# Patient Record
Sex: Female | Born: 1980 | Hispanic: No | Marital: Single
Health system: Southern US, Community
[De-identification: ages and names within clinical notes are randomized; demographics above are authoritative.]

---

## 2004-05-23 ENCOUNTER — Other Ambulatory Visit: Admission: RE | Admit: 2004-05-23 | Discharge: 2004-05-23 | Payer: Self-pay | Admitting: Family Medicine

## 2004-05-23 ENCOUNTER — Other Ambulatory Visit: Admission: RE | Admit: 2004-05-23 | Discharge: 2004-05-23 | Payer: Self-pay | Admitting: *Deleted

## 2005-07-23 ENCOUNTER — Ambulatory Visit: Payer: Self-pay | Admitting: Family Medicine

## 2005-11-18 ENCOUNTER — Ambulatory Visit: Payer: Self-pay | Admitting: Family Medicine

## 2006-01-07 ENCOUNTER — Other Ambulatory Visit: Admission: RE | Admit: 2006-01-07 | Discharge: 2006-01-07 | Payer: Self-pay | Admitting: Obstetrics and Gynecology

## 2006-06-05 ENCOUNTER — Encounter (INDEPENDENT_AMBULATORY_CARE_PROVIDER_SITE_OTHER): Payer: Self-pay | Admitting: Specialist

## 2006-06-05 ENCOUNTER — Ambulatory Visit (HOSPITAL_COMMUNITY): Admission: RE | Admit: 2006-06-05 | Discharge: 2006-06-05 | Payer: Self-pay | Admitting: Obstetrics and Gynecology

## 2006-12-16 ENCOUNTER — Ambulatory Visit: Admission: RE | Admit: 2006-12-16 | Discharge: 2006-12-16 | Payer: Self-pay | Admitting: Gynecologic Oncology

## 2007-03-27 ENCOUNTER — Emergency Department (HOSPITAL_COMMUNITY): Admission: EM | Admit: 2007-03-27 | Discharge: 2007-03-27 | Payer: Self-pay | Admitting: Emergency Medicine

## 2007-05-12 ENCOUNTER — Ambulatory Visit: Admission: RE | Admit: 2007-05-12 | Discharge: 2007-05-12 | Payer: Self-pay | Admitting: Gynecologic Oncology

## 2008-07-19 ENCOUNTER — Ambulatory Visit: Payer: Self-pay | Admitting: Internal Medicine

## 2008-07-19 DIAGNOSIS — H6690 Otitis media, unspecified, unspecified ear: Secondary | ICD-10-CM | POA: Insufficient documentation

## 2008-12-21 ENCOUNTER — Ambulatory Visit: Admission: RE | Admit: 2008-12-21 | Discharge: 2008-12-21 | Payer: Self-pay | Admitting: Gynecologic Oncology

## 2009-04-11 ENCOUNTER — Ambulatory Visit (HOSPITAL_BASED_OUTPATIENT_CLINIC_OR_DEPARTMENT_OTHER): Admission: RE | Admit: 2009-04-11 | Discharge: 2009-04-11 | Payer: Self-pay | Admitting: Gynecologic Oncology

## 2009-05-02 ENCOUNTER — Ambulatory Visit: Admission: RE | Admit: 2009-05-02 | Discharge: 2009-05-02 | Payer: Self-pay | Admitting: Gynecologic Oncology

## 2011-01-14 ENCOUNTER — Other Ambulatory Visit: Payer: Self-pay | Admitting: Gastroenterology

## 2011-01-27 ENCOUNTER — Ambulatory Visit
Admission: RE | Admit: 2011-01-27 | Discharge: 2011-01-27 | Disposition: A | Discharge status: Home / Self Care | Payer: BC Managed Care – PPO | Source: Ambulatory Visit | Attending: Gastroenterology | Admitting: Gastroenterology

## 2011-03-26 LAB — POCT PREGNANCY, URINE

## 2011-04-29 NOTE — Consult Note (Signed)
[name], Lane NO.:  [id number]   MEDICAL RECORD NO.:  [id number]          PATIENT TYPE:  OUT   LOCATION:  GYN                          FACILITY:  [facility]   PHYSICIAN:  Laurette Schimke, MD     DATE OF BIRTH:  12-27-1980   DATE OF CONSULTATION:  12/21/2008  DATE OF DISCHARGE:                                 CONSULTATION   This is a 30 year old initially seen in 2008 for condylomatous changes.  She was treated with cryotherapy by Dr. Vincente Poli, and recurrence was  noted.  She underwent surgical excision with negative pathology.   In 2008, at the time of initial presentation, she was noted to have  multiple lesions throughout the labia majora, extending from the  posterior fourchette from the mons.  She has tried Aldara, for which she  developed a sensitivity reaction and was switched to Condylox with  improvement.  She was unable to support the use of the Condylox and  attempted a trial of TCA, which she reports was associated with a great  deal of an amount of pain and pruritus.  With the Condylox, there was  not complete compliance, and there was increase in the size of the  lesion with increasing pruritus.  She has received Gardasil and is  scheduled for a Pap test next week.   PAST MEDICAL HISTORY:  Unchanged.   PRIOR SURGICAL HISTORY:  Unchanged.   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  She is not in a relationship  right now and is reluctant to do so because of the presence of the  lesions and fear of transmitting it to a partner.   PHYSICAL EXAMINATION:  This is a well-developed female in no acute  distress.  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  No perianal lesions are  appreciated.   PLAN:  Options discussed with Amber Lane are that of continued trial of  Condylox versus excision with CO2 laser.  She  opts for definitive  treatment.  Discussion was had with Amber Lane regarding the possibility  that these lesions  could return in the future for a variety of reasons.  She is also  counseled regarding the effects of CO2 laser and postoperative care.  The plan is for surgical management of these lesions, which may include  biopsy, after she has completed her bar exam.      Laurette Schimke, MD  Electronically Signed     WB/MEDQ  D:  12/21/2008  T:  12/21/2008  Job:  [id number]   cc:   Marcelino Duster L. Vincente Poli, M.D.  Fax: [phone]   Telford Nab, R.N.  [address]  [place], [place] [address]

## 2011-04-29 NOTE — Consult Note (Signed)
Amber Lane, Amber Lane                  ACCOUNT NO.:  [id number]   MEDICAL RECORD NO.:  [id number]          PATIENT TYPE:  OUT   LOCATION:  GYN                          FACILITY:  [facility]   PHYSICIAN:  Paola A. Duard Brady, MD    DATE OF BIRTH:  Jan 24, 1981   DATE OF CONSULTATION:  05/12/2007  DATE OF DISCHARGE:                                 CONSULTATION   The patient is a 30 year old who was initially seen by me in January  2008 for condylomatous changes.  She has had cryotherapy by Dr. Vincente Poli  and noticed recurrent condyloma.  In June 2007, she underwent surgical  excision of the condyloma with negative pathology.  She continued to  have issues and was seen by [place].  Saw her in January 2008.  At that time,  she was noted to have multiple scattered condylomatous lesions  throughout the labia majora up to the posterior fourchette from the  mons.  She tried Aldara for about 3 months and had what appears to be a  sensitivity to Aldara versus just a normal reaction with some erythema  blistering and was encouraged to stop it.  She comes in today for  followup.  She did receive her third of her 3 HPV injections.  When she  had the area of blistering, she had herpes testing done by Dr. Vincente Poli  which was negative.  In addition, she had a Pap smear by Dr. Vincente Poli that  per the patient's report she thinks revealed atypical squamous cells,  and they have follow up scheduled for 6 months.  She is currently on her  menstrual cycle and is otherwise doing well.  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   PHYSICAL EXAMINATION:  VITAL SIGNS:  Weight 121 pounds.  GENERAL:  Well-nourished, well-developed female in no acute distress.  PELVIC:  External genitalia is notable for small, approximately 3-4 mm,  condylomatous lesions scattered throughout the mons extending down the  labia majora to the perineal area.  There are worse on the left than  they are on the  right though the majority of the mons lesions are on the  right side.  There is one small lesion inferior to the anus measuring  approximately 3 mm.  There is no lesions in the labia minora.  Speculum  exam was deferred as the patient had a tampon in.   ASSESSMENT:  A 30 year old with condyloma.  We had a discussion  regarding options including continuing the Aldara.  I do not think she  had an allergic reaction.  I think she just had the typical immune  related reaction to the Aldara versus Condylox versus laser ablation.  After our discussion, she wishes to proceed with Condylox.  She was  given a prescription for Condylox 0.5% gel to apply b.i.d. for 3 days  once a week for up to 4 weeks.  Instructions regarding application were  given to the patient.  The patient was shown her lesions with a mirror  and is comfortable doing this.  If after months she continues to have  lesions, she would like to consider laser ablation of these lesions  which we can schedule prior to her starting law school to give her  enough convalescence.  She will call us to schedule this if the lesions  have not resolved with the Condylox.  She will follow up with Dr. Vincente Poli  for followup of her Pap smear.      Paola A. Duard Brady, MD  Electronically Signed     PAG/MEDQ  D:  05/12/2007  T:  05/12/2007  Job:  [id number]   cc:   Marcelino Duster L. Vincente Poli, M.D.  Fax: [phone]   Telford Nab, R.N.  [address]  [place], [place] [address]

## 2011-04-29 NOTE — Consult Note (Signed)
Amber Lane, Amber Lane                  ACCOUNT NO.:  [id number]   MEDICAL RECORD NO.:  [id number]          PATIENT TYPE:  OUT   LOCATION:  GYN                          FACILITY:  [facility]   PHYSICIAN:  Paola A. Duard Brady, MD    DATE OF BIRTH:  13-Aug-1981   DATE OF CONSULTATION:  05/02/2009  DATE OF DISCHARGE:                                 CONSULTATION   Patient is a very pleasant 30 year old with a distant history of  condyloma.  She has had cryotherapy by Dr. Vincente Poli.  Most recently she  was seen by my partner, Dr. Laurette Schimke, and was noted to have fairly  extensive condylomatous changes in January of 2007.  She delayed  intervention due to financial issues.  Subsequently, on April 11, 2009,  she underwent extensive laser ablation of condylomatous lesions.  Operative findings included extensive condylomatous lesions on the  external surface just at the labia majora extending onto the mons.  The  lesions ranged anywhere from 4 to 5 mm in size to 2 cm in size.  There  were 2 small approximately 3- to 4-mm lesions of the labia minora on her  left side.  She comes in today for a postoperative check.  She overall  did very well.  She states her pain was well controlled.  She think she  feels a small scab and she is aware that there might be a new condyloma  on her mons.  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  We did have her take  her birth control pills continuously and miss her last period due to  concerns about her being able to use a tampon or pad.  She is now having  some spotting though she still has another week before her menses on  this current pill pack.  She also states that she has some hot flashes  during the month.  She has spoken to Dr. Vincente Poli about this.  She had  labs done which were negative but she does not know why she has hot  flashes.  She is on Loestrin 1/20 birth control pill.   PHYSICAL EXAMINATION:   Weight 121 pounds.  Height 5 feet 6.  Blood  pressure 110/78.  Well-nourished, well-developed female in no acute  distress.  PELVIC:  External genitalia is notable for well-healing condylomatous  lesions.  There is a scab at one on her mons, otherwise there are no new  lesions.   ASSESSMENT:  77. 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  She is healing well.  I have      encouraged her to put vitamin E on these lesions and rub it in to      help with the scarring.  She will continue close surveillance.  I      discussed with her regarding the spotting is probably because she      took her birth control pills continuously, to go ahead and stop      them today so she could force a period and  start her birth control      pills back on May 13, 2009.  2  With regard to the hot flashes, it might be because she is on  Loestrin pill.  She can discuss the pros and cons of going on a higher  dose estrogen birth control pill with Dr. Vincente Poli.  1. She wondered whether there was anything she could do to prevent an      outbreak.  She did receive the HPV vaccine which has been known to      decrease recurrent outbreaks of condylomatous lesions by at least      50% in patients who are HPV exposed prior to vaccination.  She does      not smoke.  Otherwise, there is nothing else she      can do to mitigate her symptoms.  I have encouraged her though if      she does notice some lesions to be more forthright in coming back      sooner so that they do not become so extensive.  She will continue      her followup with Dr. Vincente Poli and will return to see [place] p.r.n.      Paola A. Duard Brady, MD  Electronically Signed     PAG/MEDQ  D:  05/02/2009  T:  05/02/2009  Job:  [id number]   cc:   Marcelino Duster L. Vincente Poli, M.D.  Fax: [phone]   Telford Nab, R.N.  [address]  [place], [place] [address]

## 2011-04-29 NOTE — Op Note (Signed)
NAMEMICHAL, Amber Lane                  ACCOUNT NO.:  [id number]   MEDICAL RECORD NO.:  [id number]          PATIENT TYPE:  AMB   LOCATION:  [facility]                         FACILITY:  [facility]   PHYSICIAN:  Paola A. Duard Brady, MD    DATE OF BIRTH:  07/08/1981   DATE OF PROCEDURE:  04/11/2009  DATE OF DISCHARGE:                               OPERATIVE REPORT   PREOPERATIVE DIAGNOSIS:  Diffuse vulvar condyloma.   POSTOPERATIVE DIAGNOSIS:  Diffuse vulvar condyloma.   PROCEDURE:  Extensive laser ablation of condylomatous lesions.   ANESTHESIA:  General.   ANESTHESIOLOGIST:  Brayton Caves, MD.   SURGEON:  Paola A. Duard Brady, MD.   ESTIMATED BLOOD LOSS:  Minimal.   IV FLUIDS:  700 mL.   URINE OUTPUT:  Not reported.   COMPLICATIONS:  None.   PATHOLOGY:  None.   OPERATIVE FINDINGS:  Included extensive condylomatous lesions on the  external surface inside of the labia majora extending onto the mons.  The lesions ranged anywhere from 4-5 mm in size to 2 cm in size.  There  are two small approximately 3-4 mm lesions on the labia minora on the  patient's left side.   After informed consent was obtained the patient was examined.  Risks and  benefits of surgery reviewed.  She was taken to the operating room and  placed in a supine position.  General anesthesia with an LMA was placed.  After general anesthesia was induced she was placed in a dorsal  lithotomy position.  The perineum was cleansed with Hibiclens.  Time-out  was performed after the patient was draped to confirm the patient, the  procedure, the surgeon, antibiotic and allergy status.  The patient was  then draped with moist towels surrounding the perineum.  The laser had  been previously tested.  With a watt of 8 continuous the vulvar lesions  as described in the findings above were ablated to the appropriate  depth.  There was adequate hemostasis.  There was good cosmetic and  surgical outcome.  Silver nitrate was applied.   All  instrument and Ray-Tec counts were correct x2.  The patient  tolerated the procedure and was taken to the recovery room in stable  condition.      Paola A. Duard Brady, MD  Electronically Signed     PAG/MEDQ  D:  04/12/2009  T:  04/12/2009  Job:  [id number]   cc:   Telford Nab, R.N.  [address]  [place], [place] [address]

## 2011-05-02 NOTE — Op Note (Signed)
NAMENEVELYN, Amber Lane                  ACCOUNT NO.:  [id number]   MEDICAL RECORD NO.:  [id number]          PATIENT TYPE:  AMB   LOCATION:  [facility]                           FACILITY:  [facility]   PHYSICIAN:  Michelle L. Grewal, M.D.DATE OF BIRTH:  01-31-1981   DATE OF PROCEDURE:  06/05/2006  DATE OF DISCHARGE:                                 OPERATIVE REPORT   PREOPERATIVE DIAGNOSIS:  Vulvar condyloma.   POSTOPERATIVE DIAGNOSIS:  Vulvar condyloma.   PROCEDURE:  Resection of vulvar condyloma.   SURGEON:  Michelle L. Vincente Poli, M.D.   ANESTHESIA:  MAC with LMA.   SPECIMENS:  Condyloma.   ESTIMATED BLOOD LOSS:  Minimal.   DESCRIPTION OF PROCEDURE:  The patient was taken to the operating room.  She  was given sedation.  She was placed in the lithotomy position.  She was  prepped and draped in a sterile fashion.  Exam under anesthesia revealed two  large history of the vulvar condylomas in a linear pattern on the right and  left side with some scattered areas on the perineum and on the mons.  Local  was infiltrated with the two strips and then using a scalpel all areas were  shaved off with a scalpel and with Metzenbaum scissors except for the small  flat lesions in the perineum.  She had no pararectal lesions noted.  On the  left side hemostasis was excellent but I did reapproximate the skin edges  together with 2-0 Vicryl suture interrupted in two places.  We had excellent  reapproximation of the skin edges.  The flat lesions in the middle I elected  to use a Bovie because they were just diffuse scattered ones and I did  remove a few small ones with the Metzenbaum scissors.  The rest of the  lesions that were excised in these areas were then covered with Silvadene  and ointment.  Hemostasis was excellent.  The patient went to the recovery  room in stable condition.  All sponge, lap and instrument counts were  correct x2.  Pathology is condyloma.  Complications none.      Michelle L. Vincente Poli,  M.D.  Electronically Signed     MLG/MEDQ  D:  06/05/2006  T:  06/05/2006  Job:  [id number]

## 2011-05-02 NOTE — Consult Note (Signed)
NAMEDESHANDA, Lane                  ACCOUNT NO.:  [id number]   MEDICAL RECORD NO.:  [id number]          PATIENT TYPE:  OUT   LOCATION:  GYN                          FACILITY:  [facility]   PHYSICIAN:  Paola A. Duard Brady, MD    DATE OF BIRTH:  12-Dec-1981   DATE OF CONSULTATION:  12/16/2006  DATE OF DISCHARGE:                                 CONSULTATION   The patient is seen today in consultation request of Dr. Vincente Poli.  Ms.  Lane is a very pleasant, 30 year old, gravida 1, para 0, aborta 1, whose  last cycle was 2 weeks ago.  Starting about a year ago, she noticed some  vulvar condyloma, was treated with cryotherapy by Dr. Vincente Poli.  Had a  recurrent disease in different locations and, in June 2007, underwent  surgical excision of vulvar condyloma.  Her surgery was on June 05, 2006.  Final pathology was consistent with condyloma.  She has had  increasing condyloma since that time and comes in today for followup.  In addition, she has also had some low-grade dysplasia.  She underwent  colposcopy with biopsies and ECC February 2007, and has a followup  scheduled with Dr. Vincente Poli, January 17, for repeat Pap smear.  She was  last seen by Dr. Vincente Poli on December 4 and it was recommended that she  had vulvar condylomas throughout the vulva.  She had a wet prep that was  unremarkable secondary to complaints of vaginal discharge and is  referred to [place] today.  She is overall doing fairly well.  She did use  Aldara for a very short term prior to proceeding with her cryotherapy.  She states she may have used it once or twice and never long enough to  see if there was any benefit.  She is receiving HPV vaccine and has  received 2 out of a planned 3 vaccinations.   PAST SURGICAL HISTORY:  As above.  She has also had wisdom teeth  removal.   PAST MEDICAL HISTORY:  None.   MEDICATIONS:  Loestrin.   ALLERGIES:  None.   SOCIAL HISTORY:  She smokes tobacco socially.  She drinks alcohol  socially.  She is  single.  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   FAMILY HISTORY:  Her father had diabetes.  Her mother has pituitary  adenoma.   PHYSICAL EXAMINATION:  Height 5 feet 4 inches.  Weight 122 pounds.  Blood pressure 105/68.  Pulse 80.  Respirations 18.  Well-nourished, well-developed female in no acute distress.  PELVIC:  External genitalia is notable for multiple scattered  condylomatous lesions throughout the labia major and extending up to the  mons down to the posterior fourchette with some small scattered  condyloma in the perianal region.   ASSESSMENT:  A 29 year old with diffuse condylomatous changes.  I  discussed with her that we could proceed with laser ablation but this  would be a fairly extensive area to laser associated with some  discomfort and probably missing some classes secondary to pain and  recovery.  I also discussed that she may  be a good candidate for Aldara.  She never used it for long term and patients have had excellent results  with this.  I discussed with her that even if the Aldara does not remove  all of the condylomatous lesions, it may be able to diminish the amount  of lasering that is required.  She, at this point, is interested in  proceeding with Aldara.  She was shown her lesions with a mirror and I  described at length how to use the Aldara and to use a mirror to  carefully apply the Aldara to the affected areas.  She was given a  prescription for Aldara and return to see me in 3 months.  In the  meantime, she will follow up with Dr. Vincente Poli for followup of her Pap  smear, January 17.      Paola A. Duard Brady, MD  Electronically Signed     PAG/MEDQ  D:  12/16/2006  T:  12/16/2006  Job:  [id number]   cc:   Marcelino Duster L. Vincente Poli, M.D.  Fax: [phone]   Telford Nab, R.N.  [address]  [place], [place] [address]

## 2012-09-20 IMAGING — RF DG ESOPHAGUS
11 of 13 series · 19 of 24 positions shown · non-contrast
Comparison: None.

CLINICAL DATA: Dysphagia

ESOPHOGRAM/BARIUM SWALLOW
TECHNIQUE: Combined double contrast and single contrast
examination performed using effervescent crystals, thick barium
liquid, and thin barium liquid.
Fluoroscopy time:  1.8 minutes.

[Series 2: run · 1 of 1 slices shown (1 of 11)]
[im 1/1]
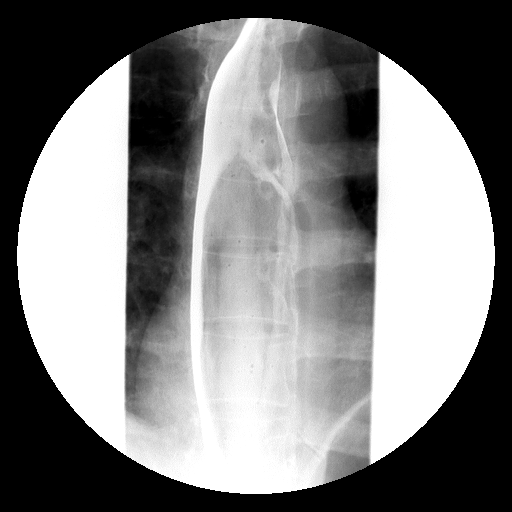

[Series 3: run · 1 of 1 slices shown (2 of 11)]
[im 1/1]
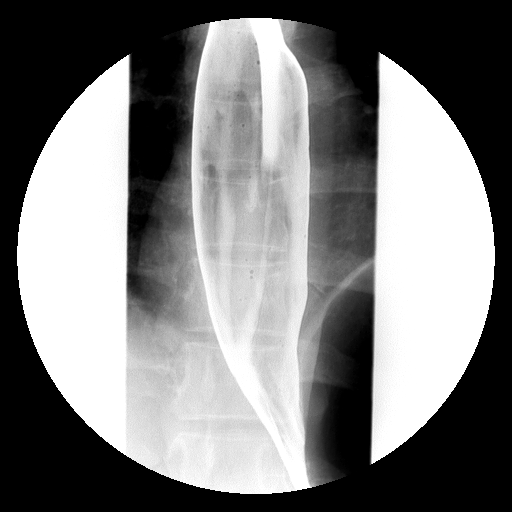

[Series 5: run · 1 of 1 slices shown (3 of 11)]
[im 1/1]
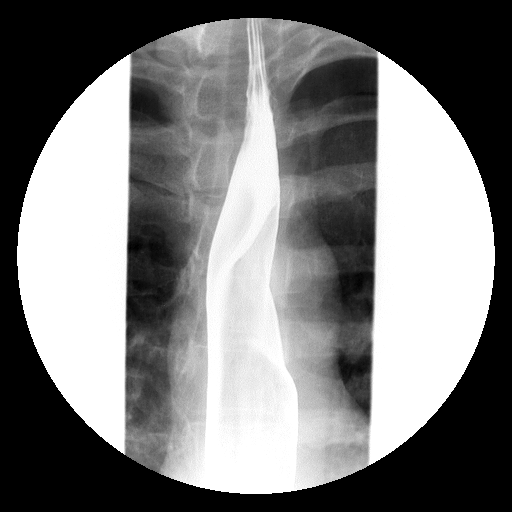

[Series 6: run · 5 of 7 slices shown (4 of 11)]
[im 1/7]
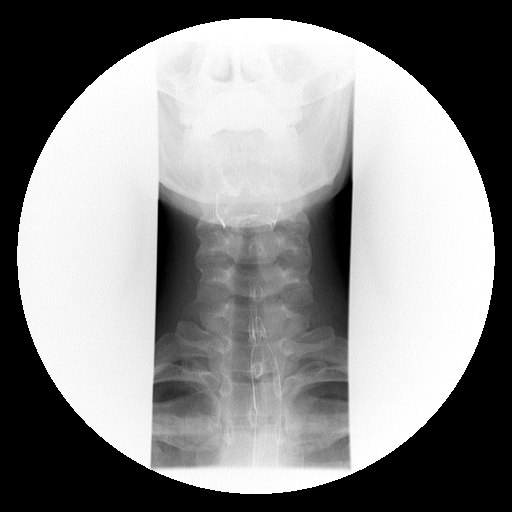
[im 2/7]
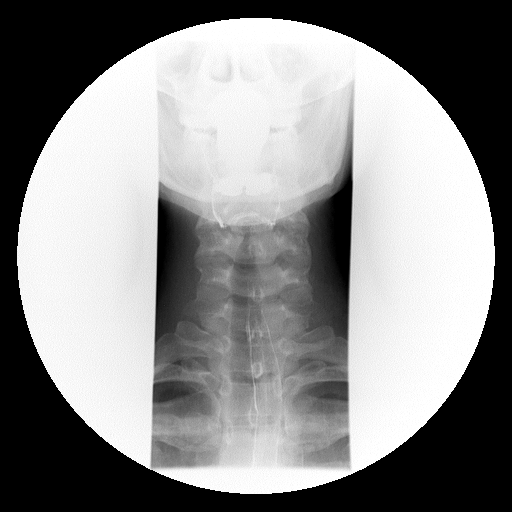
[im 3/7]
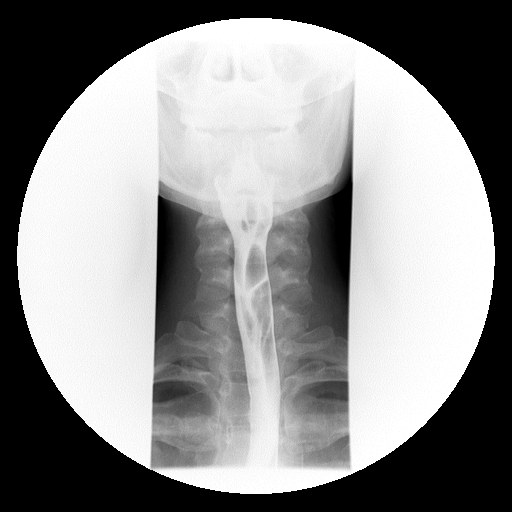
[im 5/7]
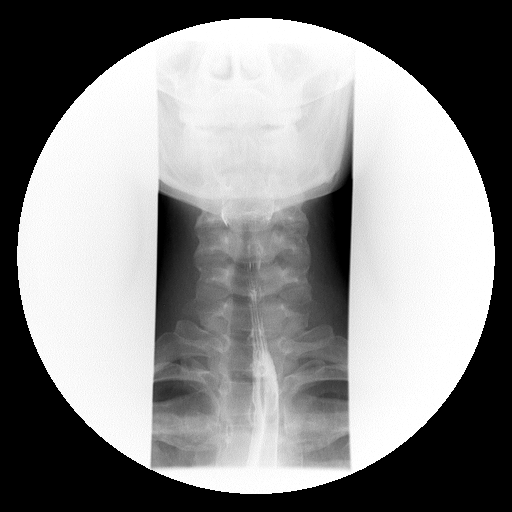
[im 7/7]
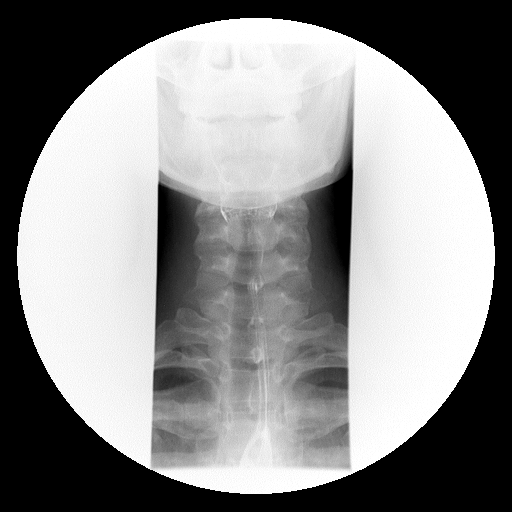

[Series 7: run · 5 of 8 slices shown (5 of 11)]
[im 1/8]
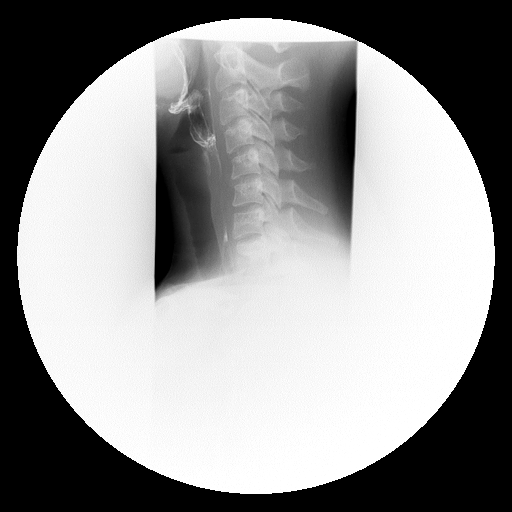
[im 3/8]
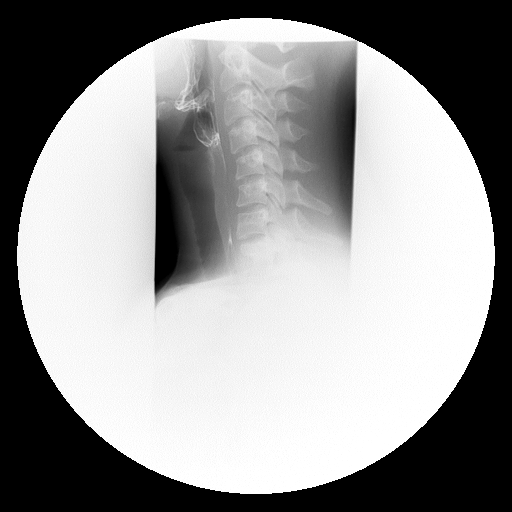
[im 4/8]
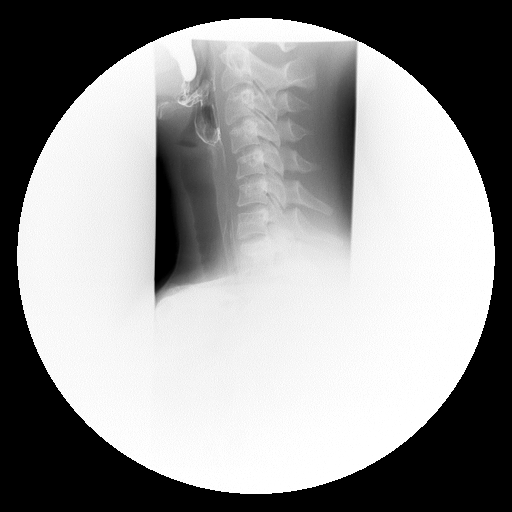
[im 5/8]
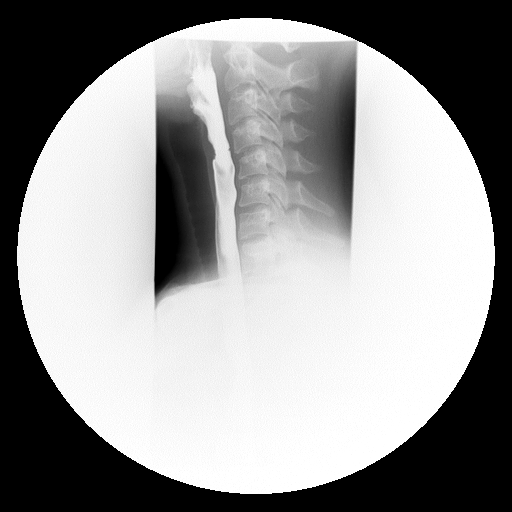
[im 6/8]
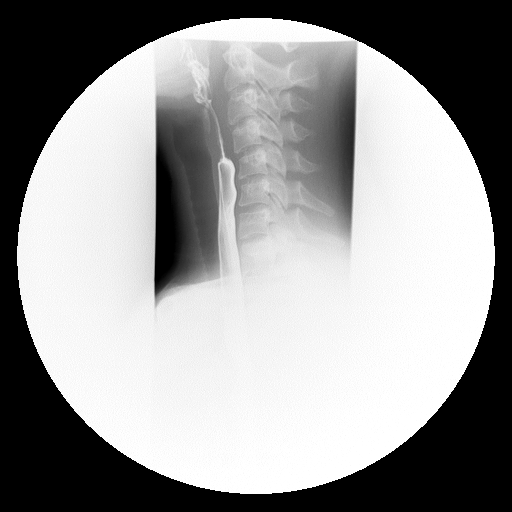

[Series 8: run · 1 of 1 slices shown (6 of 11)]
[im 1/1]
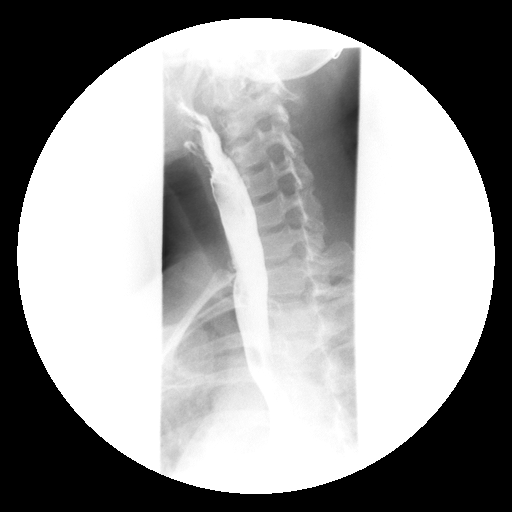

[Series 9: run · 1 of 1 slices shown (7 of 11)]
[im 1/1]
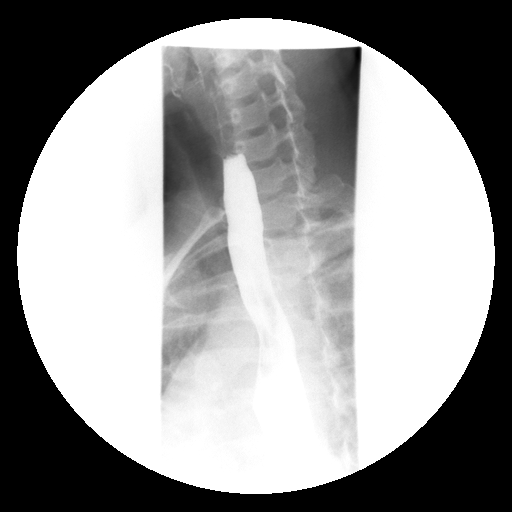

[Series 10: run · 1 of 1 slices shown (8 of 11)]
[im 1/1]
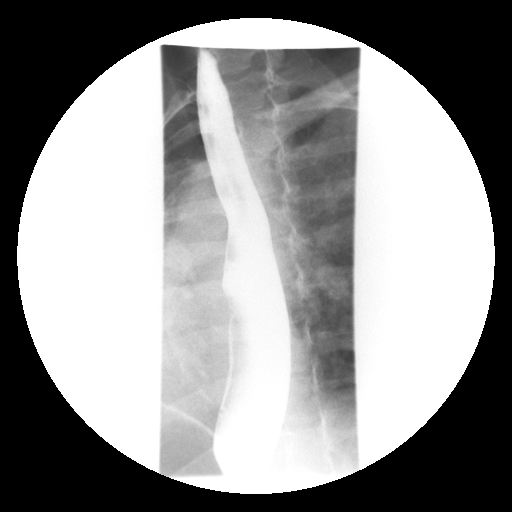

[Series 11: run · 1 of 1 slices shown (9 of 11)]
[im 1/1]
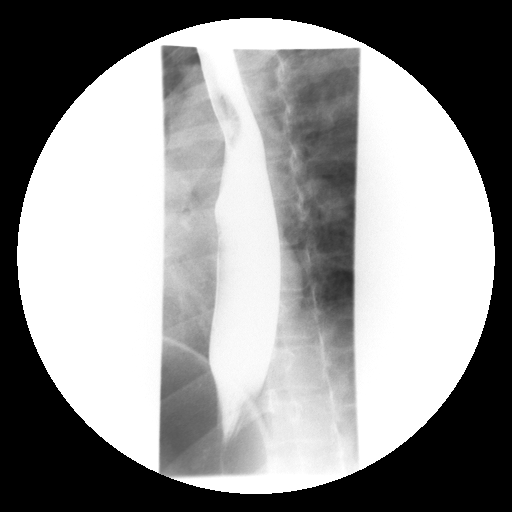

[Series 13: run · 1 of 1 slices shown (10 of 11)]
[im 1/1]
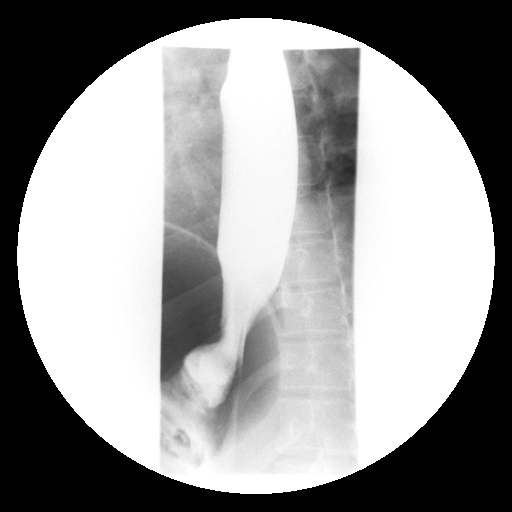

[Series 14: run · 1 of 1 slices shown (11 of 11)]
[im 1/1]
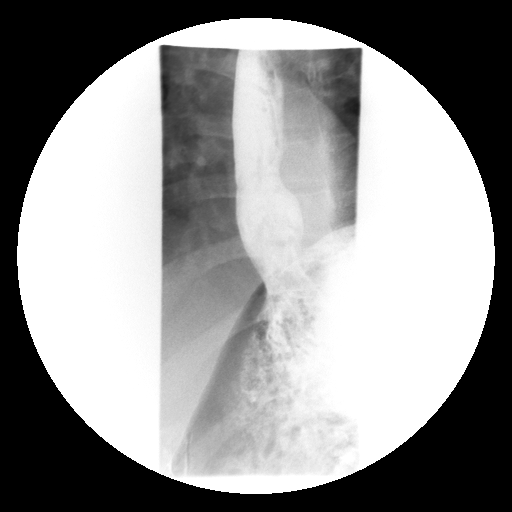

[19 of 24 positions shown; findings below may reference images not displayed]

FINDINGS: A double contrast study shows the mucosa of the
esophagus to be normal.  A single contrast study shows the
swallowing mechanism to be normal.  Esophageal peristalsis is
normal.  No hiatal hernia is seen.  However, there is moderate
gastroesophageal reflux demonstrated which occurred spontaneously.
A barium pill was given at the end of the study which did pass into
the stomach without delay.
IMPRESSION: Moderate gastroesophageal reflux.  Barium pill passes in the
stomach without delay.
